# Patient Record
Sex: Male | Born: 1965 | Race: White | Hispanic: No | Smoking: Current every day smoker
Health system: Southern US, Community
[De-identification: ages and names within clinical notes are randomized; demographics above are authoritative.]

## PROBLEM LIST (undated history)

## (undated) DIAGNOSIS — M549 Dorsalgia, unspecified: Secondary | ICD-10-CM

## (undated) DIAGNOSIS — G8929 Other chronic pain: Secondary | ICD-10-CM

## (undated) DIAGNOSIS — I219 Acute myocardial infarction, unspecified: Secondary | ICD-10-CM

## (undated) HISTORY — PX: CORONARY ANGIOPLASTY WITH STENT PLACEMENT: SHX49

---

## 2015-05-29 ENCOUNTER — Encounter (HOSPITAL_COMMUNITY): Payer: Self-pay | Admitting: *Deleted

## 2015-05-29 ENCOUNTER — Emergency Department (HOSPITAL_COMMUNITY)
Admission: EM | Admit: 2015-05-29 | Discharge: 2015-05-29 | Disposition: A | Discharge status: Home / Self Care | Payer: Medicaid - Out of State | Attending: Emergency Medicine | Admitting: Emergency Medicine

## 2015-05-29 ENCOUNTER — Emergency Department (HOSPITAL_COMMUNITY): Payer: Medicaid - Out of State

## 2015-05-29 DIAGNOSIS — F0781 Postconcussional syndrome: Secondary | ICD-10-CM | POA: Insufficient documentation

## 2015-05-29 DIAGNOSIS — Z79899 Other long term (current) drug therapy: Secondary | ICD-10-CM | POA: Diagnosis not present

## 2015-05-29 DIAGNOSIS — F17200 Nicotine dependence, unspecified, uncomplicated: Secondary | ICD-10-CM | POA: Insufficient documentation

## 2015-05-29 DIAGNOSIS — L02811 Cutaneous abscess of head [any part, except face]: Secondary | ICD-10-CM | POA: Diagnosis not present

## 2015-05-29 DIAGNOSIS — G44309 Post-traumatic headache, unspecified, not intractable: Secondary | ICD-10-CM | POA: Diagnosis present

## 2015-05-29 DIAGNOSIS — Z7982 Long term (current) use of aspirin: Secondary | ICD-10-CM | POA: Diagnosis not present

## 2015-05-29 HISTORY — DX: Acute myocardial infarction, unspecified: I21.9

## 2015-05-29 HISTORY — DX: Other chronic pain: G89.29

## 2015-05-29 HISTORY — DX: Dorsalgia, unspecified: M54.9

## 2015-05-29 LAB — CBC WITH DIFFERENTIAL/PLATELET
BASOS ABS: 0 10*3/uL (ref 0.0–0.1)
Basophils Relative: 0 %
EOS PCT: 11 %
Eosinophils Absolute: 0.9 10*3/uL — ABNORMAL HIGH (ref 0.0–0.7)
HEMATOCRIT: 38.8 % — AB (ref 39.0–52.0)
Hemoglobin: 12.8 g/dL — ABNORMAL LOW (ref 13.0–17.0)
LYMPHS ABS: 3 10*3/uL (ref 0.7–4.0)
Lymphocytes Relative: 34 %
MCH: 28.9 pg (ref 26.0–34.0)
MCHC: 33 g/dL (ref 30.0–36.0)
MCV: 87.6 fL (ref 78.0–100.0)
Monocytes Absolute: 0.9 10*3/uL (ref 0.1–1.0)
Monocytes Relative: 10 %
NEUTROS ABS: 4 10*3/uL (ref 1.7–7.7)
NEUTROS PCT: 45 %
PLATELETS: 254 10*3/uL (ref 150–400)
RBC: 4.43 MIL/uL (ref 4.22–5.81)
RDW: 13.7 % (ref 11.5–15.5)
WBC: 8.9 10*3/uL (ref 4.0–10.5)

## 2015-05-29 LAB — BASIC METABOLIC PANEL
Anion gap: 6 (ref 5–15)
BUN: 13 mg/dL (ref 6–20)
CALCIUM: 8.5 mg/dL — AB (ref 8.9–10.3)
CO2: 29 mmol/L (ref 22–32)
CREATININE: 1.14 mg/dL (ref 0.61–1.24)
Chloride: 104 mmol/L (ref 101–111)
GFR calc Af Amer: >60 mL/min (ref >60)
GLUCOSE: 114 mg/dL — AB (ref 65–99)
Potassium: 3.5 mmol/L (ref 3.5–5.1)
Sodium: 139 mmol/L (ref 135–145)

## 2015-05-29 LAB — I-STAT CG4 LACTIC ACID, ED: Lactic Acid, Venous: 1.68 mmol/L (ref 0.5–2.0)

## 2015-05-29 MED ORDER — SODIUM CHLORIDE 0.9 % IV BOLUS (SEPSIS)
1000.0000 mL | Freq: Once | INTRAVENOUS | Status: AC
  Administered 2015-05-29: 1000 mL via INTRAVENOUS

## 2015-05-29 MED ORDER — DEXTROSE 5 % IV SOLN
1.0000 g | Freq: Once | INTRAVENOUS | Status: AC
  Administered 2015-05-29: 1 g via INTRAVENOUS
  Filled 2015-05-29: qty 10

## 2015-05-29 MED ORDER — FENTANYL CITRATE (PF) 100 MCG/2ML IJ SOLN
50.0000 ug | Freq: Once | INTRAMUSCULAR | Status: AC
  Administered 2015-05-29: 50 ug via INTRAVENOUS
  Filled 2015-05-29: qty 2

## 2015-05-29 MED ORDER — CEPHALEXIN 500 MG PO CAPS: 500.0000 mg | ORAL_CAPSULE | Freq: Four times a day (QID) | ORAL | Status: AC

## 2015-05-29 NOTE — ED Notes (Signed)
Pt's wound irrigated with water and hydrogen peroxide; pt tolerated well
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
## 2015-05-29 NOTE — Discharge Instructions (Signed)
Abscess An abscess is an infected area that contains a collection of pus and debris.It can occur in almost any part of the body. An abscess is also known as a furuncle or boil. CAUSES  An abscess occurs when tissue gets infected. This can occur from blockage of oil or sweat glands, infection of hair follicles, or a minor injury to the skin. As the body tries to fight the infection, pus collects in the area and creates pressure under the skin. This pressure causes pain. People with weakened immune systems have difficulty fighting infections and get certain abscesses more often.  SYMPTOMS Usually an abscess develops on the skin and becomes a painful mass that is red, warm, and tender. If the abscess forms under the skin, you may feel a moveable soft area under the skin. Some abscesses break open (rupture) on their own, but most will continue to get worse without care. The infection can spread deeper into the body and eventually into the bloodstream, causing you to feel ill.  DIAGNOSIS  Your caregiver will take your medical history and perform a physical exam. A sample of fluid may also be taken from the abscess to determine what is causing your infection. TREATMENT  Your caregiver may prescribe antibiotic medicines to fight the infection. However, taking antibiotics alone usually does not cure an abscess. Your caregiver may need to make a small cut (incision) in the abscess to drain the pus. In some cases, gauze is packed into the abscess to reduce pain and to continue draining the area. HOME CARE INSTRUCTIONS   Only take over-the-counter or prescription medicines for pain, discomfort, or fever as directed by your caregiver.  If you were prescribed antibiotics, take them as directed. Finish them even if you start to feel better.  If gauze is used, follow your caregiver's directions for changing the gauze.  To avoid spreading the infection:  Keep your draining abscess covered with a  bandage.  Wash your hands well.  Do not share personal care items, towels, or whirlpools with others.  Avoid skin contact with others.  Keep your skin and clothes clean around the abscess.  Keep all follow-up appointments as directed by your caregiver. SEEK MEDICAL CARE IF:   You have increased pain, swelling, redness, fluid drainage, or bleeding.  You have muscle aches, chills, or a general ill feeling.  You have a fever. MAKE SURE YOU:   Understand these instructions.  Will watch your condition.  Will get help right away if you are not doing well or get worse.   This information is not intended to replace advice given to you by your health care provider. Make sure you discuss any questions you have with your health care provider.   Document Released: 12/02/2004 Document Revised: 08/24/2011 Document Reviewed: 05/07/2011 Elsevier Interactive Patient Education 2016 Elsevier Inc.  Post-Concussion Syndrome Post-concussion syndrome describes the symptoms that can occur after a head injury. These symptoms can last from weeks to months. CAUSES  It is not clear why some head injuries cause post-concussion syndrome. It can occur whether your head injury was mild or severe and whether you were wearing head protection or not.  SIGNS AND SYMPTOMS  Memory difficulties.  Dizziness.  Headaches.  Double vision or blurry vision.  Sensitivity to light.  Hearing difficulties.  Depression.  Tiredness.  Weakness.  Difficulty with concentration.  Difficulty sleeping or staying asleep.  Vomiting.  Poor balance or instability on your feet.  Slow reaction time.  Difficulty learning and remembering  things you have heard. DIAGNOSIS  There is no test to determine whether you have post-concussion syndrome. Your health care provider may order an imaging scan of your brain, such as a CT scan, to check for other problems that may be causing your symptoms (such as a severe injury  inside your skull). TREATMENT  Usually, these problems disappear over time without medical care. Your health care provider may prescribe medicine to help ease your symptoms. It is important to follow up with a neurologist to evaluate your recovery and address any lingering symptoms or issues. HOME CARE INSTRUCTIONS   Take medicines only as directed by your health care provider. Do not take aspirin. Aspirin can slow blood clotting.  Sleep with your head slightly elevated to help with headaches.  Avoid any situation where there is potential for another head injury. This includes football, hockey, soccer, basketball, martial arts, downhill snow sports, and horseback riding. Your condition will get worse every time you experience a concussion. You should avoid these activities until you are evaluated by the appropriate follow-up health care providers.  Keep all follow-up visits as directed by your health care provider. This is important. SEEK MEDICAL CARE IF:  You have increased problems paying attention or concentrating.  You have increased difficulty remembering or learning new information.  You need more time to complete tasks or assignments than before.  You have increased irritability or decreased ability to cope with stress.  You have more symptoms than before. Seek medical care if you have any of the following symptoms for more than two weeks after your injury:  Lasting (chronic) headaches.  Dizziness or balance problems.  Nausea.  Vision problems.  Increased sensitivity to noise or light.  Depression or mood swings.  Anxiety or irritability.  Memory problems.  Difficulty concentrating or paying attention.  Sleep problems.  Feeling tired all the time. SEEK IMMEDIATE MEDICAL CARE IF:  You have confusion or unusual drowsiness.  Others find it difficult to wake you up.  You have nausea or persistent, forceful vomiting.  You feel like you are moving when you are  not (vertigo). Your eyes may move rapidly back and forth.  You have convulsions or faint.  You have severe, persistent headaches that are not relieved by medicine.  You cannot use your arms or legs normally.  One of your pupils is larger than the other.  You have clear or bloody discharge from your nose or ears.  Your problems are getting worse, not better. MAKE SURE YOU:  Understand these instructions.  Will watch your condition.  Will get help right away if you are not doing well or get worse.   This information is not intended to replace advice given to you by your health care provider. Make sure you discuss any questions you have with your health care provider.   Document Released: 08/14/2001 Document Revised: 03/15/2014 Document Reviewed: 05/30/2013 Elsevier Interactive Patient Education [employer] Elsevier Inc.
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
## 2015-06-01 LAB — WOUND CULTURE

## 2015-06-02 ENCOUNTER — Telehealth (HOSPITAL_BASED_OUTPATIENT_CLINIC_OR_DEPARTMENT_OTHER): Payer: Self-pay | Admitting: Emergency Medicine

## 2015-06-02 NOTE — Telephone Encounter (Signed)
Post ED Visit - Positive Culture Follow-up  Culture report reviewed by antimicrobial stewardship pharmacist:  []  Douglas BiNathan Craig, Pharm.D. []  Douglas Craig, [address] BCPS []  Douglas Craig, Pharm.D. []  Douglas Craig, Pharm.D., BCPS []  Douglas Craig, [place] BCPS, AAHIVP []  Douglas Craig, Pharm.D., BCPS, AAHIVP []  Douglas Craig, Pharm.D. []  Douglas Craig, [address]Douglas Craig. Douglas Craig PharmD  Positive wound culture Staph Treated with staph,  organism sensitive to the same and no further patient follow-up is required at this time.  Douglas Craig, Douglas Craig 06/02/2015, 10:13 AM

## 2015-10-27 ENCOUNTER — Encounter (HOSPITAL_COMMUNITY): Payer: Self-pay | Admitting: *Deleted

## 2015-10-27 ENCOUNTER — Emergency Department (HOSPITAL_COMMUNITY)
Admission: EM | Admit: 2015-10-27 | Discharge: 2015-10-27 | Disposition: A | Discharge status: Home / Self Care | Payer: Medicaid - Out of State | Attending: Emergency Medicine | Admitting: Emergency Medicine

## 2015-10-27 DIAGNOSIS — F17200 Nicotine dependence, unspecified, uncomplicated: Secondary | ICD-10-CM | POA: Insufficient documentation

## 2015-10-27 DIAGNOSIS — L03116 Cellulitis of left lower limb: Secondary | ICD-10-CM | POA: Insufficient documentation

## 2015-10-27 DIAGNOSIS — L03115 Cellulitis of right lower limb: Secondary | ICD-10-CM

## 2015-10-27 DIAGNOSIS — Z79899 Other long term (current) drug therapy: Secondary | ICD-10-CM | POA: Insufficient documentation

## 2015-10-27 DIAGNOSIS — Z7982 Long term (current) use of aspirin: Secondary | ICD-10-CM | POA: Insufficient documentation

## 2015-10-27 MED ORDER — SULFAMETHOXAZOLE-TRIMETHOPRIM 800-160 MG PO TABS
1.0000 | ORAL_TABLET | Freq: Once | ORAL | Status: AC
  Administered 2015-10-27: 1 via ORAL
  Filled 2015-10-27: qty 1

## 2015-10-27 MED ORDER — SULFAMETHOXAZOLE-TRIMETHOPRIM 800-160 MG PO TABS: 1.0000 | ORAL_TABLET | Freq: Two times a day (BID) | ORAL | 0 refills | Status: AC

## 2015-10-27 NOTE — ED Triage Notes (Signed)
Pt c/o redness, swelling, pain to right lower leg that started several days ago, did take a few antibiotics that he had access to with improvement,

## 2015-10-27 NOTE — Discharge Instructions (Signed)
Take the antibiotic until gone. Elevate your feet. Recheck if you get a fever, chills, vomiting, increased redness, swelling or pain in your lower legs or you get drainage from the skin or a red streak running up your leg.

## 2015-10-27 NOTE — ED Provider Notes (Signed)
[facility]-EMERGENCY DEPT Provider Note   CSN: [id number] Arrival date & time: 10/27/15  0112  Time Seen 01:46 AM   History   Chief Complaint Chief Complaint  Patient presents with  . Leg Pain    HPI Douglas Craig is a 50 y.o. male.  HPI patient reports about 3 weeks ago he had an area on a anterior right knee that got swollen and red and he was prescribed an antibiotic that started with D. He reports about 2 or 3 days ago he had some redness circumferentially around both of his legs but worse on the right. He had some old amoxicillin that he took 2 days ago and he states the redness is improving. However he has run out of that medication. He denies fever, chills, drainage from the wounds. He states he's never had infections in his skin before. He denies any known injury.   [place] Dr Idelle CrouchM [place]  Past Medical History:  Diagnosis Date  . Chronic back pain   . Heart attack (HCC)     There are no active problems to display for this patient.   Past Surgical History:  Procedure Laterality Date  . CORONARY ANGIOPLASTY WITH STENT PLACEMENT         Home Medications    Prior to Admission medications   Medication Sig Start Date End Date Taking? Authorizing Provider  aspirin 81 MG tablet Take 81 mg by mouth daily.   Yes Historical Provider, MD  baclofen (LIORESAL) 10 MG tablet Take 10 mg by mouth 3 (three) times daily.   Yes Historical Provider, MD  clopidogrel (PLAVIX) 75 MG tablet Take 75 mg by mouth daily.   Yes Historical Provider, MD  diazepam (VALIUM) 10 MG tablet Take 10 mg by mouth every 6 (six) hours as needed for anxiety.   Yes Historical Provider, MD  metoprolol tartrate (LOPRESSOR) 25 MG tablet Take 25 mg by mouth 2 (two) times daily.   Yes Historical Provider, MD  oxycodone (OXY-IR) 5 MG capsule Take 15 mg by mouth every 4 (four) hours as needed.   Yes Historical Provider, MD  ramipril (ALTACE) 10 MG capsule Take 10 mg by mouth daily.   Yes Historical Provider,  MD  cephALEXin (KEFLEX) 500 MG capsule Take 1 capsule (500 mg total) by mouth 4 (four) times daily. 05/29/15   Kristen N Ward, DO  sulfamethoxazole-trimethoprim (BACTRIM DS,SEPTRA DS) 800-160 MG tablet Take 1 tablet by mouth 2 (two) times daily. 10/27/15   Devoria AlbeIva Deepti Gunawan, MD    Family History No family history on file.  Social History Social History  Substance Use Topics  . Smoking status: Current Every Day Smoker  . Smokeless tobacco: Never Used  . Alcohol use Yes  unemployed Smokes 1 1/2 ppd   Allergies   Review of patient's allergies indicates no known allergies.   Review of Systems Review of Systems  All other systems reviewed and are negative.    Physical Exam Updated Vital Signs BP 147/92   Pulse 66   Temp 98 F (36.7 C) (Oral)   Resp 20   Ht 5\' 8"  (1.727 m)   Wt 190 lb (86.2 kg)   SpO2 97%   BMI 28.89 kg/m   Vital signs normal    Physical Exam  Constitutional: He is oriented to person, place, and time. He appears well-developed and well-nourished.  Non-toxic appearance. He does not appear ill. No distress.  Speaks softly and rapidly and rambles and mumbles  HENT:  Head:  Normocephalic and atraumatic.  Right Ear: External ear normal.  Left Ear: External ear normal.  Nose: Nose normal. No mucosal edema or rhinorrhea.  Mouth/Throat: Mucous membranes are normal. No dental abscesses or uvula swelling.  Eyes: Conjunctivae and EOM are normal.  Neck: Normal range of motion and full passive range of motion without pain.  Cardiovascular: Normal rate.   Pulmonary/Chest: Effort normal. No respiratory distress. He has no rhonchi. He exhibits no crepitus.  Abdominal: Normal appearance. There is no rebound.  Musculoskeletal: Normal range of motion. He exhibits edema. He exhibits no tenderness.  Moves all extremities well. Patient is noted to have some circumferential redness of both lower extremities that stops in a well demarcated line consistent with the top of his socks  with some increased redness of the skin. There is some increased swelling especially on the right side. His calves are nontender. He has good distal pulses and capillary refill.  Neurological: He is alert and oriented to person, place, and time. He has normal strength. No cranial nerve deficit.  Skin: Skin is warm, dry and intact. No rash noted. No erythema. No pallor.  Psychiatric: He has a normal mood and affect. His speech is normal and behavior is normal. His mood appears not anxious.  Nursing note and vitals reviewed.      ED Treatments / Results   No results found.  Procedures Procedures (including critical care time)    Initial Impression / Assessment and Plan / ED Course  I have reviewed the triage vital signs and the nursing notes.  Pertinent labs & imaging results that were available during my care of the patient were reviewed by me and considered in my medical decision making (see chart for details).  Clinical Course   Patient appears to have some cellulitis of both lower extremities although it is unusual that it is well demarcated inferiorly and superiorly. However he states it got better when he took the amoxicillin. He was discharged home on antibiotics and follow up with his PCP if he is not improving. He should be rechecked if he gets fever, chills, vomiting, or worsening redness, swelling, red streaks running up his leg.   Final Clinical Impressions(s) / ED Diagnoses   Final diagnoses:  Cellulitis of both lower extremities    New Prescriptions New Prescriptions   SULFAMETHOXAZOLE-TRIMETHOPRIM (BACTRIM DS,SEPTRA DS) 800-160 MG TABLET    Take 1 tablet by mouth 2 (two) times daily.     Devoria AlbeIva Isaiha Asare, MD 10/27/15 [phone]

## 2016-06-20 ENCOUNTER — Encounter (HOSPITAL_COMMUNITY): Payer: Self-pay | Admitting: *Deleted

## 2016-06-20 ENCOUNTER — Emergency Department (HOSPITAL_COMMUNITY)
Admission: EM | Admit: 2016-06-20 | Discharge: 2016-06-20 | Disposition: A | Discharge status: Home / Self Care | Payer: Self-pay | Attending: Emergency Medicine | Admitting: Emergency Medicine

## 2016-06-20 DIAGNOSIS — Z7982 Long term (current) use of aspirin: Secondary | ICD-10-CM | POA: Insufficient documentation

## 2016-06-20 DIAGNOSIS — Z79899 Other long term (current) drug therapy: Secondary | ICD-10-CM | POA: Insufficient documentation

## 2016-06-20 DIAGNOSIS — M5441 Lumbago with sciatica, right side: Secondary | ICD-10-CM | POA: Insufficient documentation

## 2016-06-20 DIAGNOSIS — F17200 Nicotine dependence, unspecified, uncomplicated: Secondary | ICD-10-CM | POA: Insufficient documentation

## 2016-06-20 MED ORDER — METHYLPREDNISOLONE 4 MG PO TBPK: ORAL_TABLET | ORAL | 0 refills | Status: AC

## 2016-06-20 MED ORDER — NAPROXEN 500 MG PO TABS: 500.0000 mg | ORAL_TABLET | Freq: Two times a day (BID) | ORAL | 0 refills | Status: AC

## 2016-06-20 MED ORDER — NAPROXEN 250 MG PO TABS
500.0000 mg | ORAL_TABLET | Freq: Once | ORAL | Status: AC
  Administered 2016-06-20: 500 mg via ORAL

## 2016-06-20 MED ORDER — NAPROXEN 250 MG PO TABS
ORAL_TABLET | ORAL | Status: AC
  Administered 2016-06-20: 500 mg via ORAL
  Filled 2016-06-20: qty 2

## 2016-06-20 NOTE — ED Provider Notes (Signed)
[facility]Y DEPT Provider Note   CSN: [id number] Arrival date & time: 06/20/16  4098     History   Chief Complaint Chief Complaint  Patient presents with  . Leg Pain    HPI Douglas Craig is a 51 y.o. male.  Patient with history of chronic back pain on chronic oxycodone as well as Valium presenting with pain in his right leg that has been coming and going for the past 2 weeks. Denies any falls or injury. Describes pain mostly in his thigh on the anterior and posterior parts of his leg that radiates down his leg. Denies any change in his chronic back pain. Denies any focal weakness, numbness or tingling. Denies any fever or vomiting. Denies any bowel or bladder incontinence. Denies any chest pain or shortness of breath. Denies any IV drug abuse. Denies any history of cancer. He's been taking his chronic pain medication without relief.   The history is provided by the patient.  Leg Pain      Past Medical History:  Diagnosis Date  . Chronic back pain   . Heart attack (HCC)     There are no active problems to display for this patient.   Past Surgical History:  Procedure Laterality Date  . CORONARY ANGIOPLASTY WITH STENT PLACEMENT         Home Medications    Prior to Admission medications   Medication Sig Start Date End Date Taking? Authorizing Provider  aspirin 81 MG tablet Take 81 mg by mouth daily.   Yes Historical Provider, MD  baclofen (LIORESAL) 10 MG tablet Take 10 mg by mouth 3 (three) times daily.   Yes Historical Provider, MD  clopidogrel (PLAVIX) 75 MG tablet Take 75 mg by mouth daily.   Yes Historical Provider, MD  diazepam (VALIUM) 10 MG tablet Take 10 mg by mouth every 6 (six) hours as needed for anxiety.   Yes Historical Provider, MD  metoprolol tartrate (LOPRESSOR) 25 MG tablet Take 25 mg by mouth 2 (two) times daily.   Yes Historical Provider, MD  oxycodone (OXY-IR) 5 MG capsule Take 15 mg by mouth every 4 (four) hours as needed.   Yes Historical  Provider, MD  ramipril (ALTACE) 10 MG capsule Take 10 mg by mouth daily.   Yes Historical Provider, MD  cephALEXin (KEFLEX) 500 MG capsule Take 1 capsule (500 mg total) by mouth 4 (four) times daily. 05/29/15   Kristen N Ward, DO  sulfamethoxazole-trimethoprim (BACTRIM DS,SEPTRA DS) 800-160 MG tablet Take 1 tablet by mouth 2 (two) times daily. 10/27/15   Devoria Albe, MD    Family History No family history on file.  Social History Social History  Substance Use Topics  . Smoking status: Current Every Day Smoker  . Smokeless tobacco: Never Used  . Alcohol use Yes     Allergies   Patient has no known allergies.   Review of Systems Review of Systems  Constitutional: Negative for activity change, appetite change and fever.  HENT: Negative for congestion and rhinorrhea.   Respiratory: Negative for cough, chest tightness and shortness of breath.   Cardiovascular: Negative for chest pain.  Gastrointestinal: Negative for abdominal pain, nausea and vomiting.  Genitourinary: Negative for dysuria and hematuria.  Musculoskeletal: Positive for arthralgias, back pain and myalgias.  Neurological: Negative for dizziness, weakness, light-headedness and headaches.   A complete 10 system review of systems was obtained and all systems are negative except as noted in the HPI and PMH.    Physical Exam Updated Vital  Signs BP (!) 145/90 (BP Location: Left Arm)   Pulse 81   Temp 97.9 F (36.6 C) (Oral)   Resp 16   Ht  (1.727 m)   Wt 225 lb (102.1 kg)   SpO2 96%   BMI 34.21 kg/m   Physical Exam  Constitutional: He is oriented to person, place, and time. He appears well-developed and well-nourished. No distress.  Appears intoxicated  HENT:  Head: Normocephalic and atraumatic.  Mouth/Throat: Oropharynx is clear and moist. No oropharyngeal exudate.  Eyes: Conjunctivae and EOM are normal. Pupils are equal, round, and reactive to light.  Neck: Normal range of motion. Neck supple.  No  meningismus.  Cardiovascular: Normal rate, regular rhythm, normal heart sounds and intact distal pulses.   No murmur heard. Pulmonary/Chest: Effort normal and breath sounds normal. No respiratory distress.  Abdominal: Soft. There is no tenderness. There is no rebound and no guarding.  Musculoskeletal: Normal range of motion. He exhibits no edema or tenderness.  5/5 strength in bilateral lower extremities. Ankle plantar and dorsiflexion intact. Great toe extension intact bilaterally. +2 DP and PT pulses. +2 patellar reflexes bilaterally. Normal gait.   Neurological: He is alert and oriented to person, place, and time. No cranial nerve deficit. He exhibits normal muscle tone. Coordination normal.  No ataxia on finger to nose bilaterally. No pronator drift. 5/5 strength throughout. CN 2-12 intact.Equal grip strength. Sensation intact.   Skin: Skin is warm.  Psychiatric: He has a normal mood and affect. His behavior is normal.  Nursing note and vitals reviewed.    ED Treatments / Results  Labs (all labs ordered are listed, but only abnormal results are displayed) Labs Reviewed - No data to display  EKG  EKG Interpretation None       Radiology No results found.  Procedures Procedures (including critical care time)  Medications Ordered in ED Medications - No data to display   Initial Impression / Assessment and Plan / ED Course  I have reviewed the triage vital signs and the nursing notes.  Pertinent labs & imaging results that were available during my care of the patient were reviewed by me and considered in my medical decision making (see chart for details).     Patient with right leg pain as well as chronic back pain. He is neurovascularly intact with intact pulses. Equal strength and sensation.  No evidence of limb ischemia. Compartment soft. No evidence of cord compression or cauda equina.  Suspect sciatica patient's chronic back issues. He is on chronic pain  medication as well as Valium. We'll add steroids.  No evidence of neurosurgical emergency. Patient is ambulatory. Follow-up with PCP. Return precautions discussed.  Final Clinical Impressions(s) / ED Diagnoses   Final diagnoses:  Acute bilateral low back pain with right-sided sciatica    New Prescriptions New Prescriptions   No medications on file     Glynn Octave, MD 06/20/16 [phone]

## 2016-06-20 NOTE — Discharge Instructions (Signed)
Take your medications as prescribed. Follow up with your doctor. Return to the ED if you develop new or worsening symptoms.  °

## 2016-06-20 NOTE — ED Triage Notes (Signed)
Pt c/o right leg pain for the past few weeks, denies any injury,

## 2017-03-22 IMAGING — CT CT HEAD W/O CM
1 series · 15 of 30 positions shown, 19 images · non-contrast
Comparison: None.

CLINICAL DATA: Head area 10 days ago or are and stitches. Weakness
and altered mental status. Headache. Posterior scalp abscess.

EXAM:
CT HEAD WITHOUT CONTRAST
TECHNIQUE: Contiguous axial images were obtained from the base of the skull
through the vertex without intravenous contrast.

[Series 2: headtrauma 4.8 h37s · axial · 0.48mm/px · z∈[+56,+210]mm · 15 of 36 slices shown, 19 images]
[im 2/36  brain]
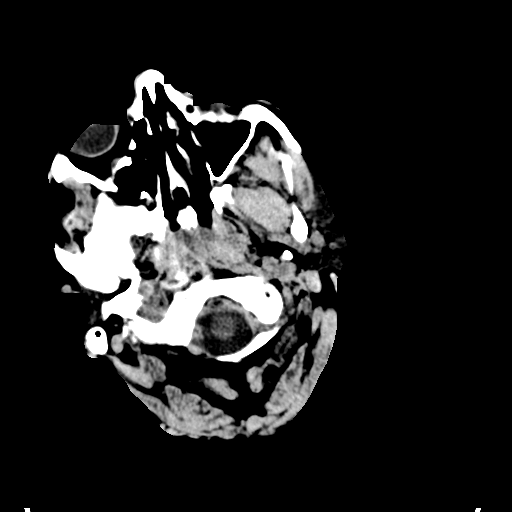
[im 2/36  bone]
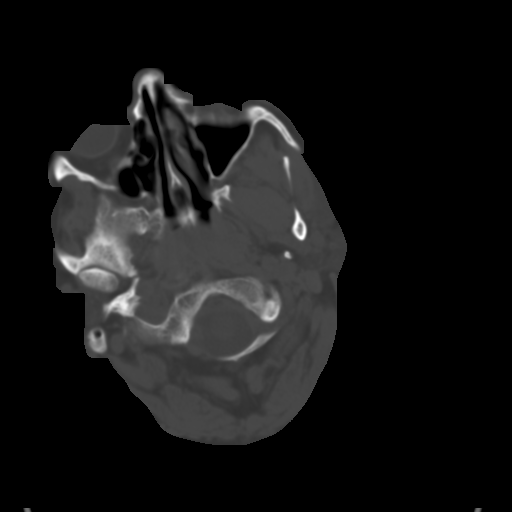
[im 4/36  brain]
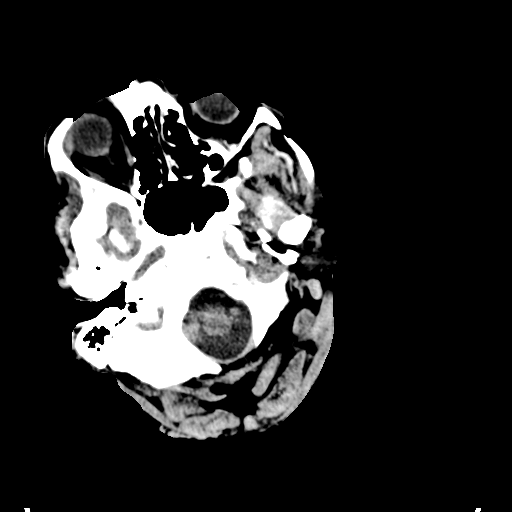
[im 7/36  brain]
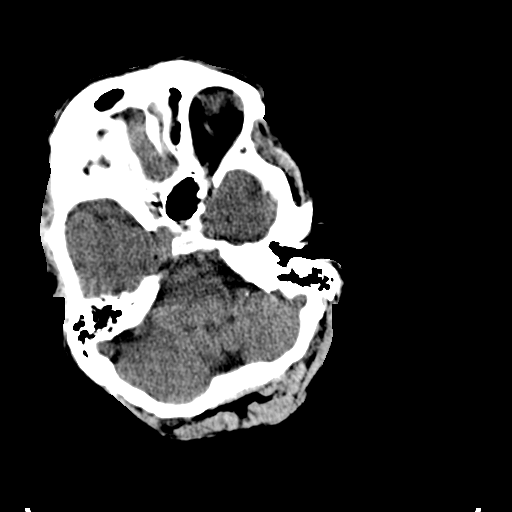
[im 9/36  brain]
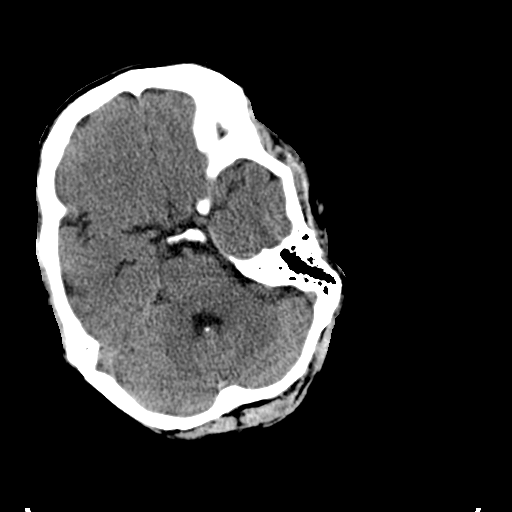
[im 11/36  brain]
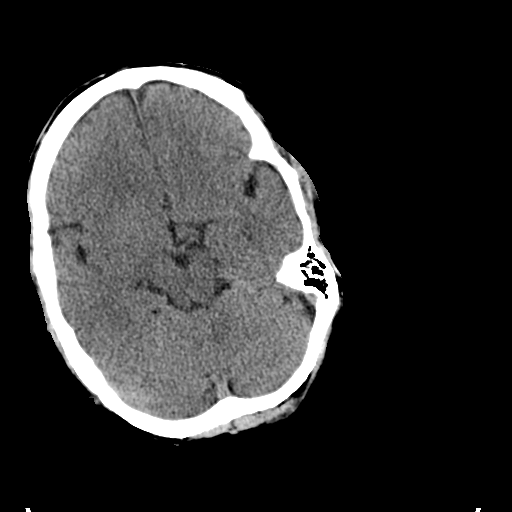
[im 11/36  bone]
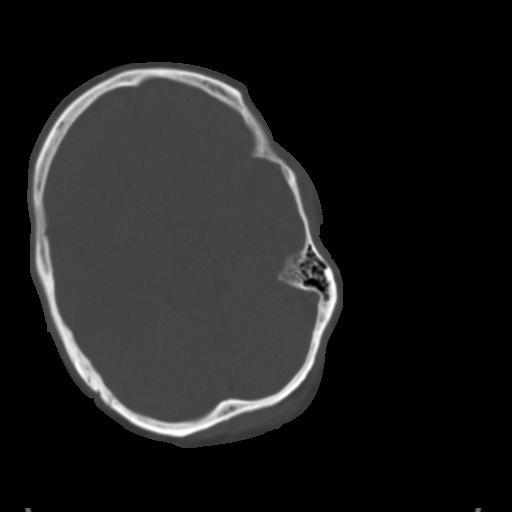
[im 14/36  brain]
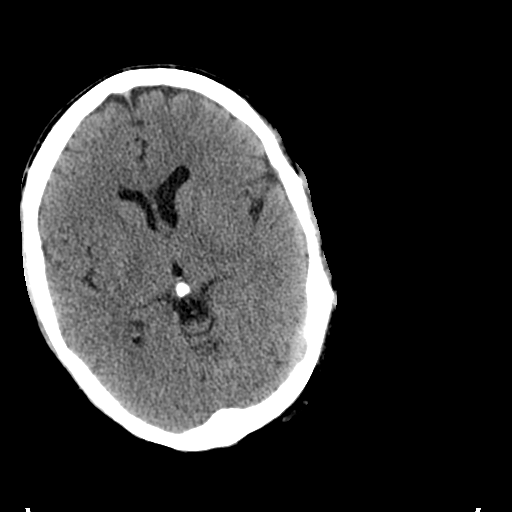
[im 16/36  brain]
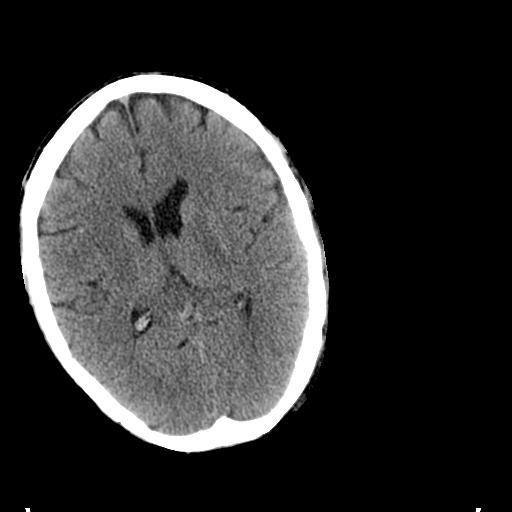
[im 19/36  brain]
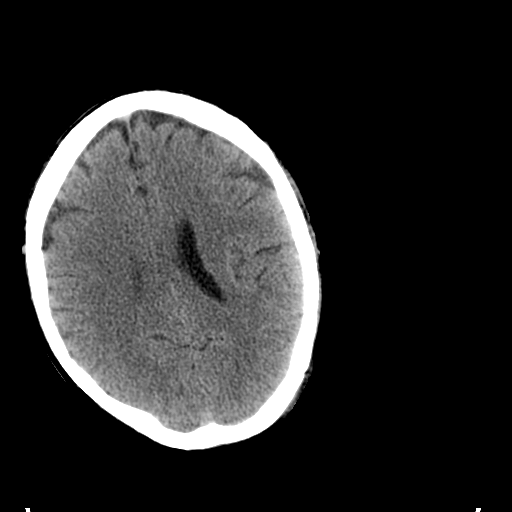
[im 20/36  brain]
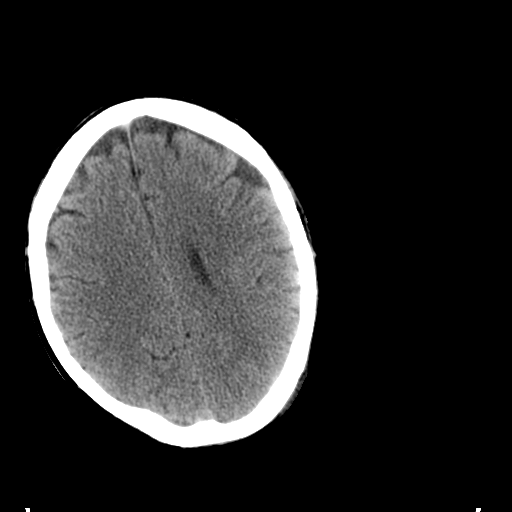
[im 20/36  bone]
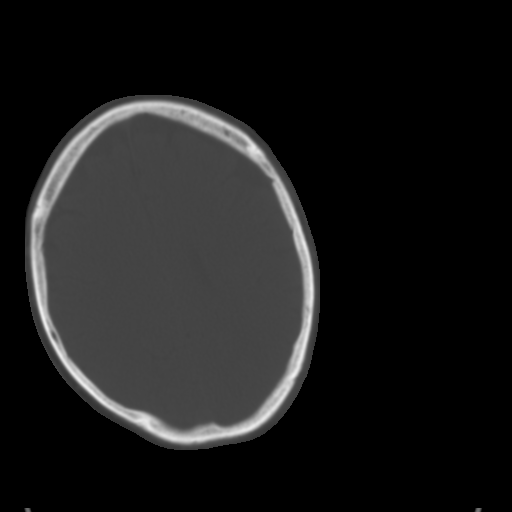
[im 22/36  brain]
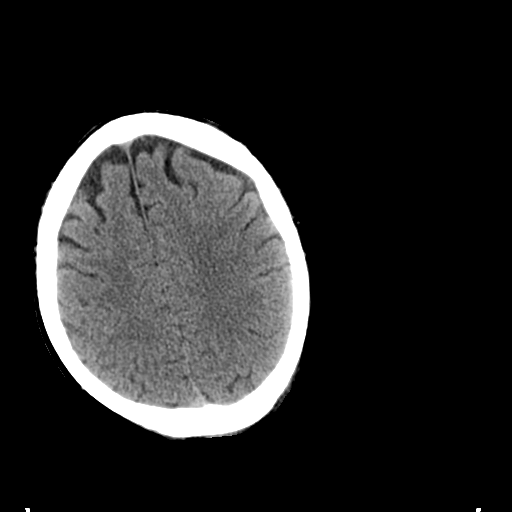
[im 25/36  brain]
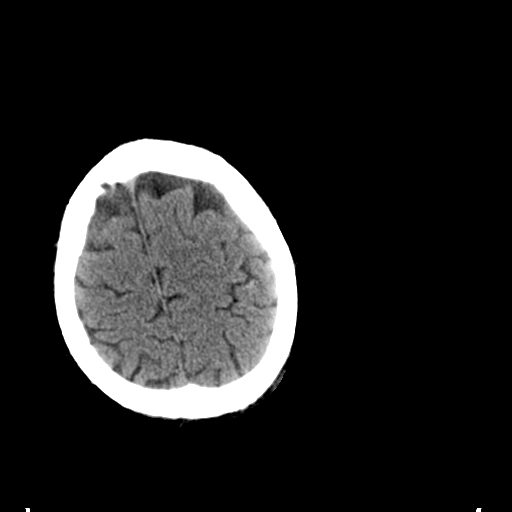
[im 27/36  brain]
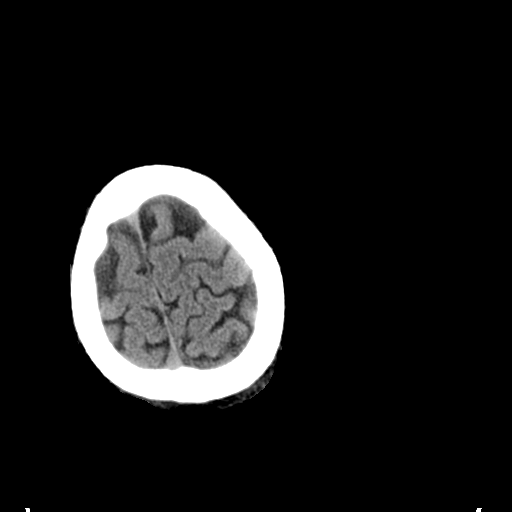
[im 29/36  brain]
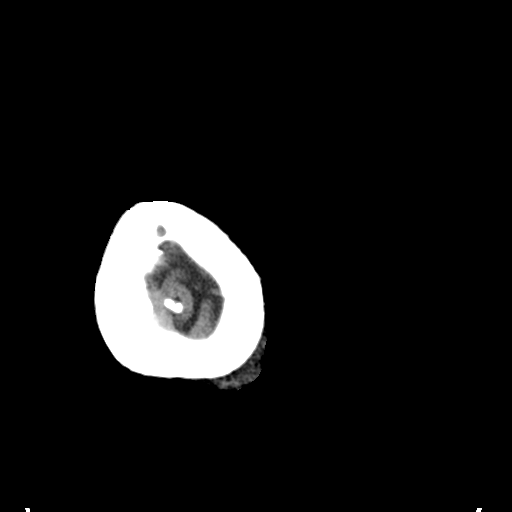
[im 29/36  bone]
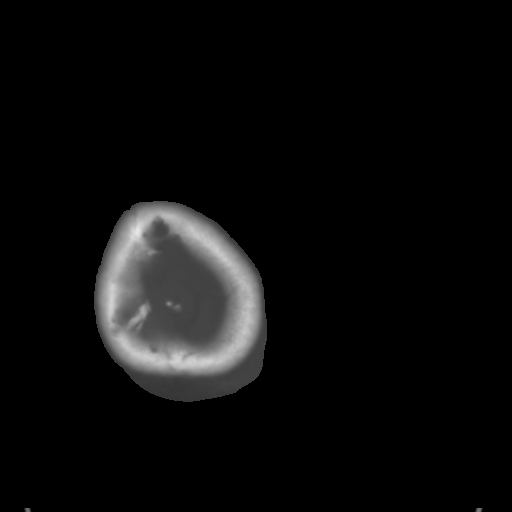
[im 32/36  brain]
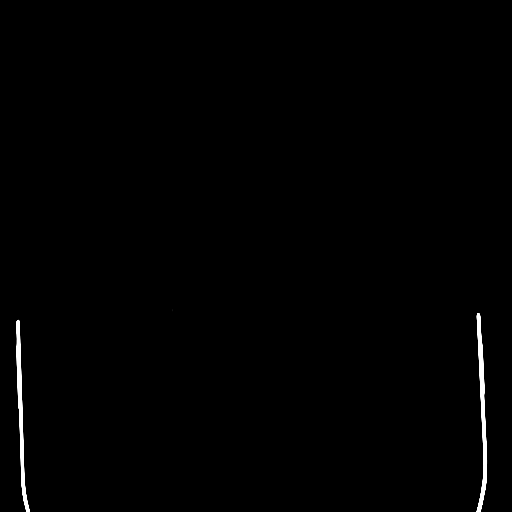
[im 34/36  brain]
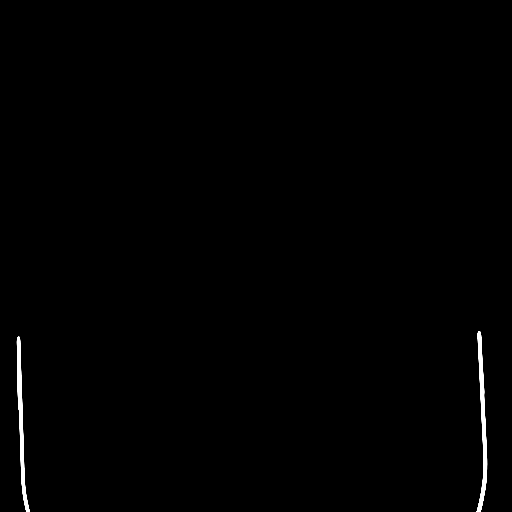

[15 of 30 positions shown; findings below may reference images not displayed]

FINDINGS: Subcutaneous soft tissue swelling over the posterior vertex with
tiny subcutaneous gas bubble. This may be due to postoperative
change, laceration, or infection. Underlying calvarium appears
intact. Ventricles and sulci appear symmetrical. No ventricular
dilatation. No mass effect or midline shift. No abnormal extra-axial
fluid collections. Gray-white matter junctions are distinct. Basal
cisterns are not effaced. No evidence of acute intracranial
hemorrhage. No depressed skull fractures. Visualized paranasal
sinuses and mastoid air cells are not opacified.
IMPRESSION: Soft tissue swelling over the posterior vertex with tiny soft tissue
gas possibly indicating postoperative change, laceration, or
infection. No discrete abscess cavity identified. No acute
intracranial abnormalities.
